# Patient Record
Sex: Female | Born: 1990 | Race: White | Hispanic: No | Marital: Single | Smoking: Current every day smoker
Health system: Southern US, Community
[De-identification: ages and names within clinical notes are randomized; demographics above are authoritative.]

## PROBLEM LIST (undated history)

## (undated) DIAGNOSIS — K219 Gastro-esophageal reflux disease without esophagitis: Secondary | ICD-10-CM

## (undated) DIAGNOSIS — F419 Anxiety disorder, unspecified: Secondary | ICD-10-CM

## (undated) DIAGNOSIS — I10 Essential (primary) hypertension: Secondary | ICD-10-CM

## (undated) DIAGNOSIS — F329 Major depressive disorder, single episode, unspecified: Secondary | ICD-10-CM

## (undated) DIAGNOSIS — F32A Depression, unspecified: Secondary | ICD-10-CM

## (undated) HISTORY — PX: WISDOM TOOTH EXTRACTION: SHX21

## (undated) HISTORY — PX: TONSILLECTOMY: SUR1361

---

## 2018-06-12 ENCOUNTER — Emergency Department (HOSPITAL_COMMUNITY)
Admission: EM | Admit: 2018-06-12 | Discharge: 2018-06-12 | Disposition: A | Discharge status: Home / Self Care | Payer: Commercial Managed Care - PPO | Attending: Emergency Medicine | Admitting: Emergency Medicine

## 2018-06-12 ENCOUNTER — Emergency Department (HOSPITAL_COMMUNITY): Payer: Commercial Managed Care - PPO

## 2018-06-12 ENCOUNTER — Other Ambulatory Visit: Payer: Self-pay

## 2018-06-12 ENCOUNTER — Encounter (HOSPITAL_COMMUNITY): Payer: Self-pay | Admitting: *Deleted

## 2018-06-12 DIAGNOSIS — Z79899 Other long term (current) drug therapy: Secondary | ICD-10-CM | POA: Insufficient documentation

## 2018-06-12 DIAGNOSIS — F17210 Nicotine dependence, cigarettes, uncomplicated: Secondary | ICD-10-CM | POA: Diagnosis not present

## 2018-06-12 DIAGNOSIS — R109 Unspecified abdominal pain: Secondary | ICD-10-CM

## 2018-06-12 DIAGNOSIS — R1084 Generalized abdominal pain: Secondary | ICD-10-CM | POA: Insufficient documentation

## 2018-06-12 DIAGNOSIS — I10 Essential (primary) hypertension: Secondary | ICD-10-CM | POA: Diagnosis not present

## 2018-06-12 HISTORY — DX: Depression, unspecified: F32.A

## 2018-06-12 HISTORY — DX: Gastro-esophageal reflux disease without esophagitis: K21.9

## 2018-06-12 HISTORY — DX: Essential (primary) hypertension: I10

## 2018-06-12 HISTORY — DX: Anxiety disorder, unspecified: F41.9

## 2018-06-12 HISTORY — DX: Major depressive disorder, single episode, unspecified: F32.9

## 2018-06-12 LAB — URINALYSIS, ROUTINE W REFLEX MICROSCOPIC
Bilirubin Urine: NEGATIVE
GLUCOSE, UA: NEGATIVE mg/dL
HGB URINE DIPSTICK: NEGATIVE
Ketones, ur: NEGATIVE mg/dL
LEUKOCYTES UA: NEGATIVE
Nitrite: NEGATIVE
Protein, ur: NEGATIVE mg/dL
Specific Gravity, Urine: 1.015 (ref 1.005–1.030)
pH: 8 (ref 5.0–8.0)

## 2018-06-12 LAB — POC URINE PREG, ED: PREG TEST UR: NEGATIVE

## 2018-06-12 MED ORDER — KETOROLAC TROMETHAMINE 15 MG/ML IJ SOLN
15.0000 mg | Freq: Once | INTRAMUSCULAR | Status: AC
  Administered 2018-06-12: 15 mg via INTRAVENOUS

## 2018-06-12 MED ORDER — KETOROLAC TROMETHAMINE 60 MG/2ML IM SOLN
60.0000 mg | Freq: Once | INTRAMUSCULAR | Status: DC
  Filled 2018-06-12: qty 2

## 2018-06-12 MED ORDER — CYCLOBENZAPRINE HCL 10 MG PO TABS: 10.0000 mg | ORAL_TABLET | Freq: Two times a day (BID) | ORAL | 0 refills | Status: AC | PRN

## 2018-06-12 MED ORDER — IBUPROFEN 800 MG PO TABS: 800.0000 mg | ORAL_TABLET | Freq: Three times a day (TID) | ORAL | 0 refills | Status: AC | PRN

## 2018-06-12 NOTE — ED Provider Notes (Signed)
[facility]-EMERGENCY DEPT Provider Note   CSN: [id number] Arrival date & time: 06/12/18  1128     History   Chief Complaint Chief Complaint  Patient presents with  . Flank Pain    HPI Carrie Lowe is a 27 y.o. female.  The history is provided by the patient. No language interpreter was used.  Flank Pain    Carrie Lowe is a 27 y.o. female who presents to the Emergency Department complaining of flank pain. Presents to the emergency department complaining of left flank pain. She is experienced some muscle cramping described as a Charlie horse sensation intermittently in the left flank for the last several days. Today she had a light sneeze and develop severe pain in the left side. Pain is worse with movement and laying flat. Being still lessons or pain. Pain is also worse when she takes a deep breath but she denies any shortness of breath. No fevers, nausea, vomiting, abdominal pain, dysuria, hematuria, numbness, weakness. She is able to ambulate but has pain in her side when she moves. No prior similar symptoms. She has a history of hypertension. She is a smoker. She does not using contraceptives. Past Medical History:  Diagnosis Date  . Anxiety   . Depression   . GERD (gastroesophageal reflux disease)   . Hypertension     There are no active problems to display for this patient.   Past Surgical History:  Procedure Laterality Date  . TONSILLECTOMY    . WISDOM TOOTH EXTRACTION       OB History   None      Home Medications    Prior to Admission medications   Medication Sig Start Date End Date Taking? Authorizing Provider  desvenlafaxine (PRISTIQ) 50 MG 24 hr tablet Take 50 mg by mouth daily before breakfast. 05/22/18  Yes [provider]  lisinopril (PRINIVIL,ZESTRIL) 20 MG tablet Take 20 mg by mouth daily. 03/29/18  Yes [provider]  omeprazole (PRILOSEC) 20 MG capsule Take 20 mg by mouth daily. 03/29/18  Yes [provider]  traZODone (DESYREL) 50 MG tablet Take 50 mg by mouth at bedtime. 05/22/18  Yes [provider]  ALPRAZolam Prudy Feeler(XANAX) 0.5 MG tablet Take 0.5 mg by mouth daily as needed for anxiety. 05/22/18   [provider]  cyclobenzaprine (FLEXERIL) 10 MG tablet Take 1 tablet (10 mg total) by mouth 2 (two) times daily as needed for muscle spasms. 06/12/18   Tilden Fossaees, Haelie Clapp, MD  ibuprofen (ADVIL,MOTRIN) 800 MG tablet Take 1 tablet (800 mg total) by mouth every 8 (eight) hours as needed. 06/12/18   Tilden Fossaees, Avenir Lozinski, MD  ranitidine (ZANTAC) 300 MG tablet Take 300 mg by mouth at bedtime. 06/05/18   [provider]    Family History No family history on file.  Social History Social History   Tobacco Use  . Smoking status: Current Every Day Smoker    Packs/day: 0.75    Types: Cigarettes  . Smokeless tobacco: Never Used  Substance Use Topics  . Alcohol use: Yes    Comment: social  . Drug use: Never     Allergies   Diphenhydramine and Sulfa antibiotics   Review of Systems Review of Systems  Genitourinary: Positive for flank pain.  All other systems reviewed and are negative.    Physical Exam Updated Vital Signs BP 130/90 (BP Location: Left Arm)   Pulse 62   Temp 98 F (36.7 C) (Oral)   Resp 16   Ht 5\' 5"  (  1.651 m)   Wt 97.5 kg   LMP 04/24/2018   SpO2 100%   BMI 35.78 kg/m   Physical Exam  Constitutional: She is oriented to person, place, and time. She appears well-developed and well-nourished.  HENT:  Head: Normocephalic and atraumatic.  Cardiovascular: Normal rate and regular rhythm.  No murmur heard. Pulmonary/Chest: Effort normal and breath sounds normal. No respiratory distress.  Mild left mid axillary tenderness to palpation  Abdominal: Soft. There is no tenderness. There is no rebound and no guarding.  Musculoskeletal: She exhibits no edema or tenderness.  Neurological: She is alert and oriented to person, place, and time.  Sensation to light touch  intact in all four extremities.    Skin: Skin is warm and dry.  Psychiatric: She has a normal mood and affect. Her behavior is normal.  Nursing note and vitals reviewed.    ED Treatments / Results  Labs (all labs ordered are listed, but only abnormal results are displayed) Labs Reviewed  URINALYSIS, ROUTINE W REFLEX MICROSCOPIC  POC URINE PREG, ED    EKG None  Radiology Dg Ribs Unilateral W/chest Left  Result Date: 06/12/2018 CLINICAL DATA:  Posterolateral left lower rib pain after sneezing EXAM: LEFT RIBS AND CHEST - 3+ VIEW COMPARISON:  None. FINDINGS: Heart and mediastinal contours are within normal limits. No focal opacities or effusions. No acute bony abnormality. No visible rib fracture or pneumothorax. IMPRESSION: Negative. Electronically Signed   By: Charlett NoseKevin  Dover M.D.   On: 06/12/2018 13:46    Procedures Procedures (including critical care time)  Medications Ordered in ED Medications  ketorolac (TORADOL) 15 MG/ML injection 15 mg (15 mg Intravenous Given 06/12/18 1309)     Initial Impression / Assessment and Plan / ED Course  I have reviewed the triage vital signs and the nursing notes.  Pertinent labs & imaging results that were available during my care of the patient were reviewed by me and considered in my medical decision making (see chart for details).     Pt here for evaluation of left flank pain. She is neurovascular intact on examination. Presentation is not consistent with PE (perc negative), Renal colic, UTI, cauda equina, shingles. Pain is improved after Toradol in the department. Discussed patient home care for flank pain. Discussed outpatient follow-up as well as return precautions.  Final Clinical Impressions(s) / ED Diagnoses   Final diagnoses:  Left flank pain    ED Discharge Orders         Ordered    cyclobenzaprine (FLEXERIL) 10 MG tablet  2 times daily PRN     06/12/18 1413    ibuprofen (ADVIL,MOTRIN) 800 MG tablet  Every 8 hours PRN      06/12/18 1413           Tilden Fossaees, Silvino Selman, MD 06/12/18 [phone]

## 2018-06-12 NOTE — ED Triage Notes (Signed)
Pt reports flank pain x 5 or 6 days.  Pt reports pain increases with movement. Pt stood up and sneezed this morning and pain began severe.  Pt reports she hasn't been taking anything pain medication.  Pt denies n/v, fevers and chills, or urinary symptoms.  Pt a/o x 4 and ambulatory.

## 2019-12-01 IMAGING — CR DG RIBS W/ CHEST 3+V*L*
4 series · 4 of 4 positions shown · non-contrast
Comparison: None.

CLINICAL DATA: Posterolateral left lower rib pain after sneezing

EXAM:
LEFT RIBS AND CHEST - 3+ VIEW

[w chest pa]
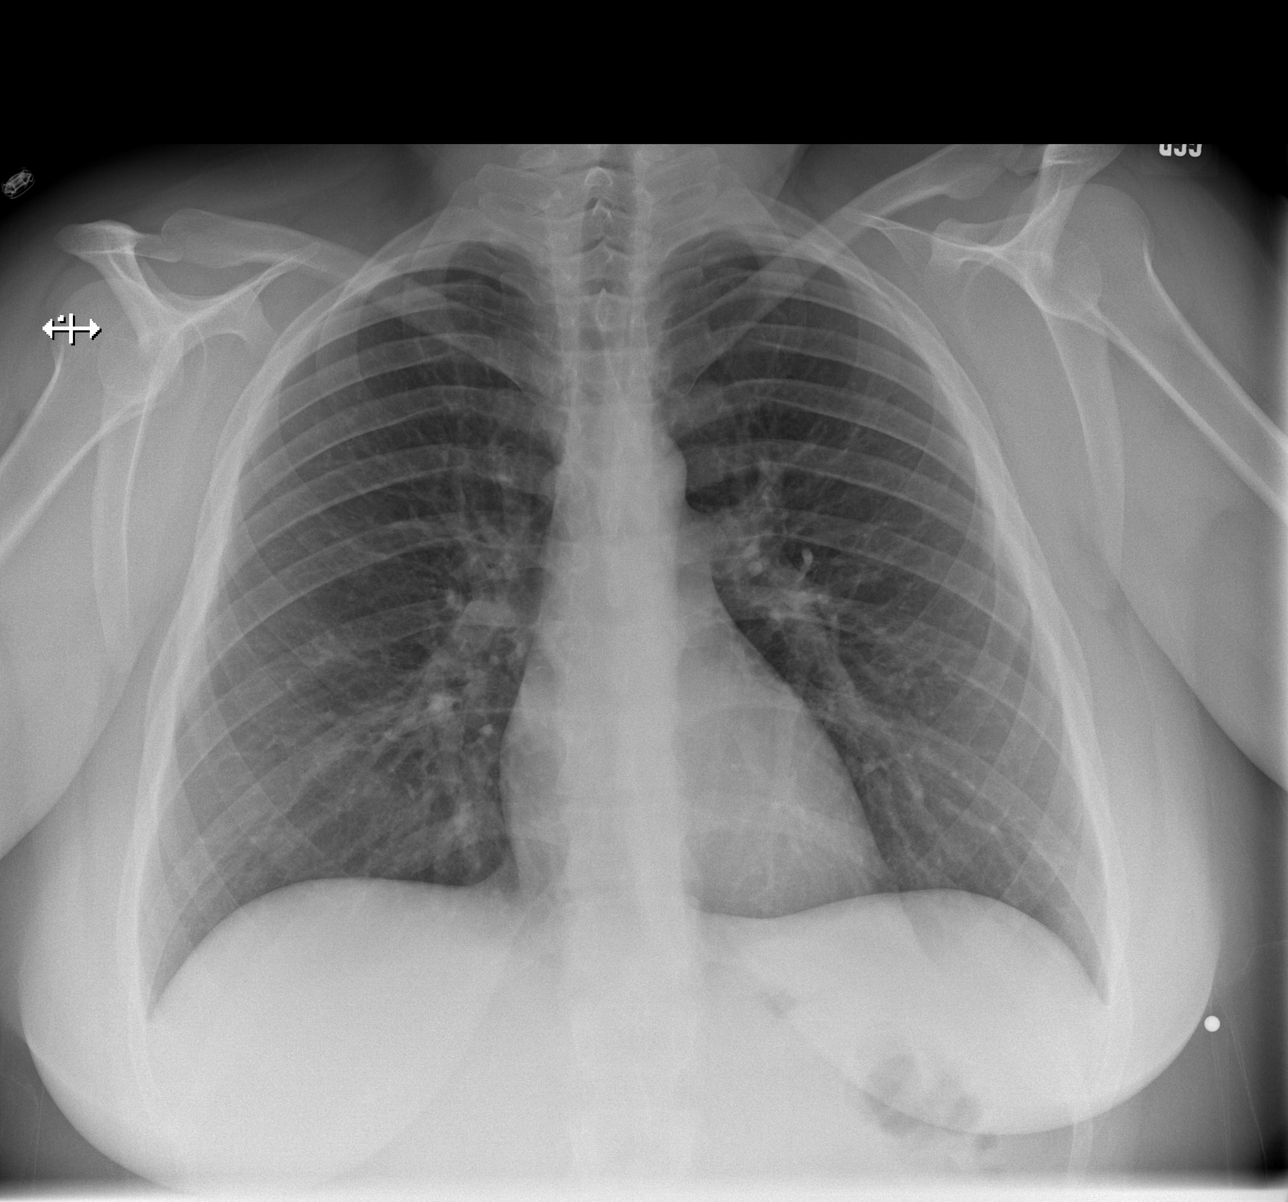

[w ribs ap lower left]
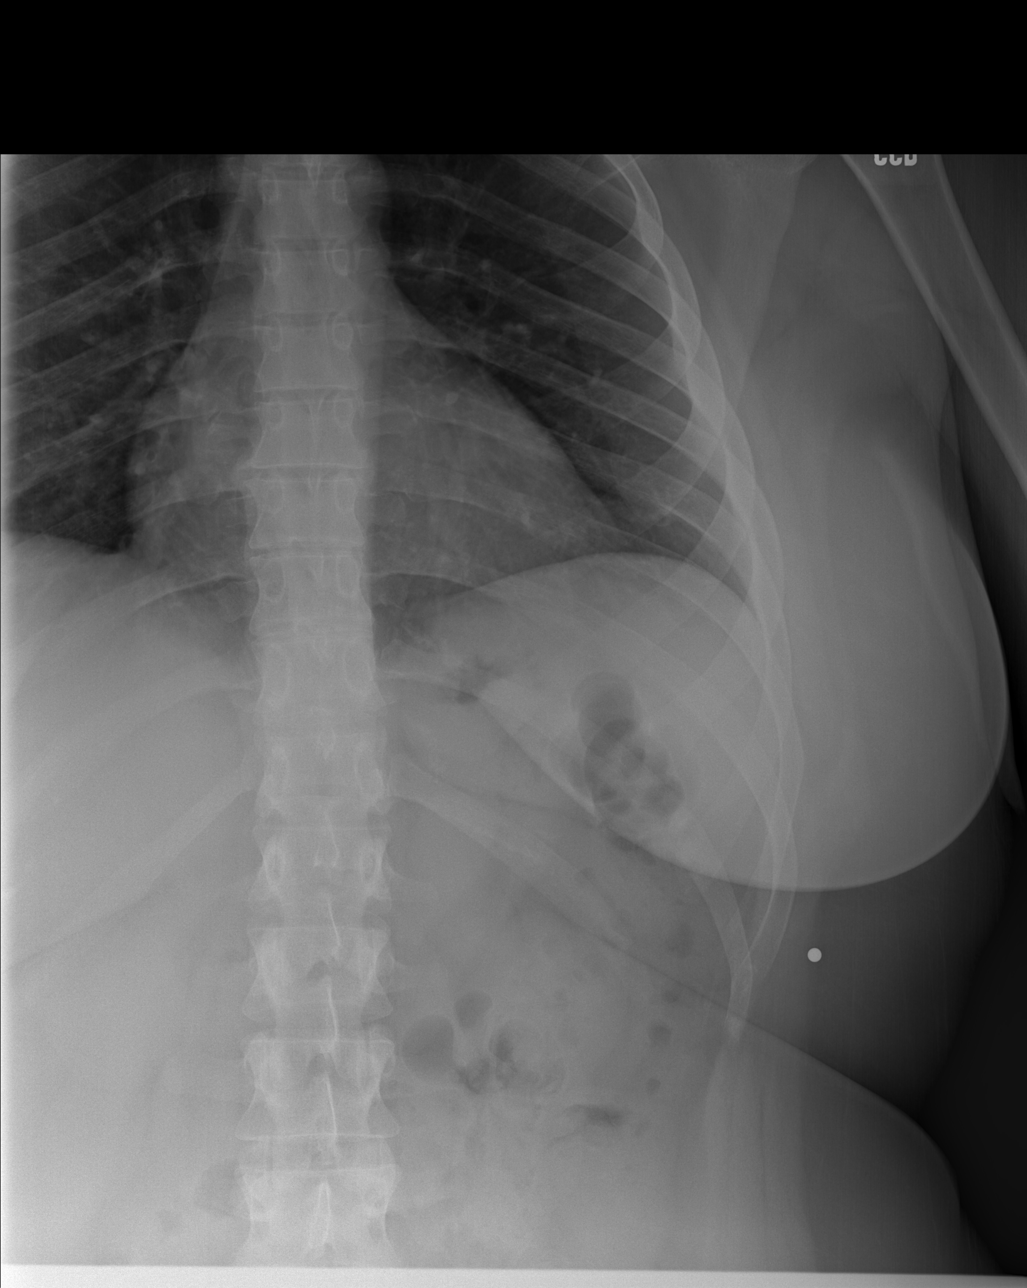

[w ribs obl left (1 of 2)]
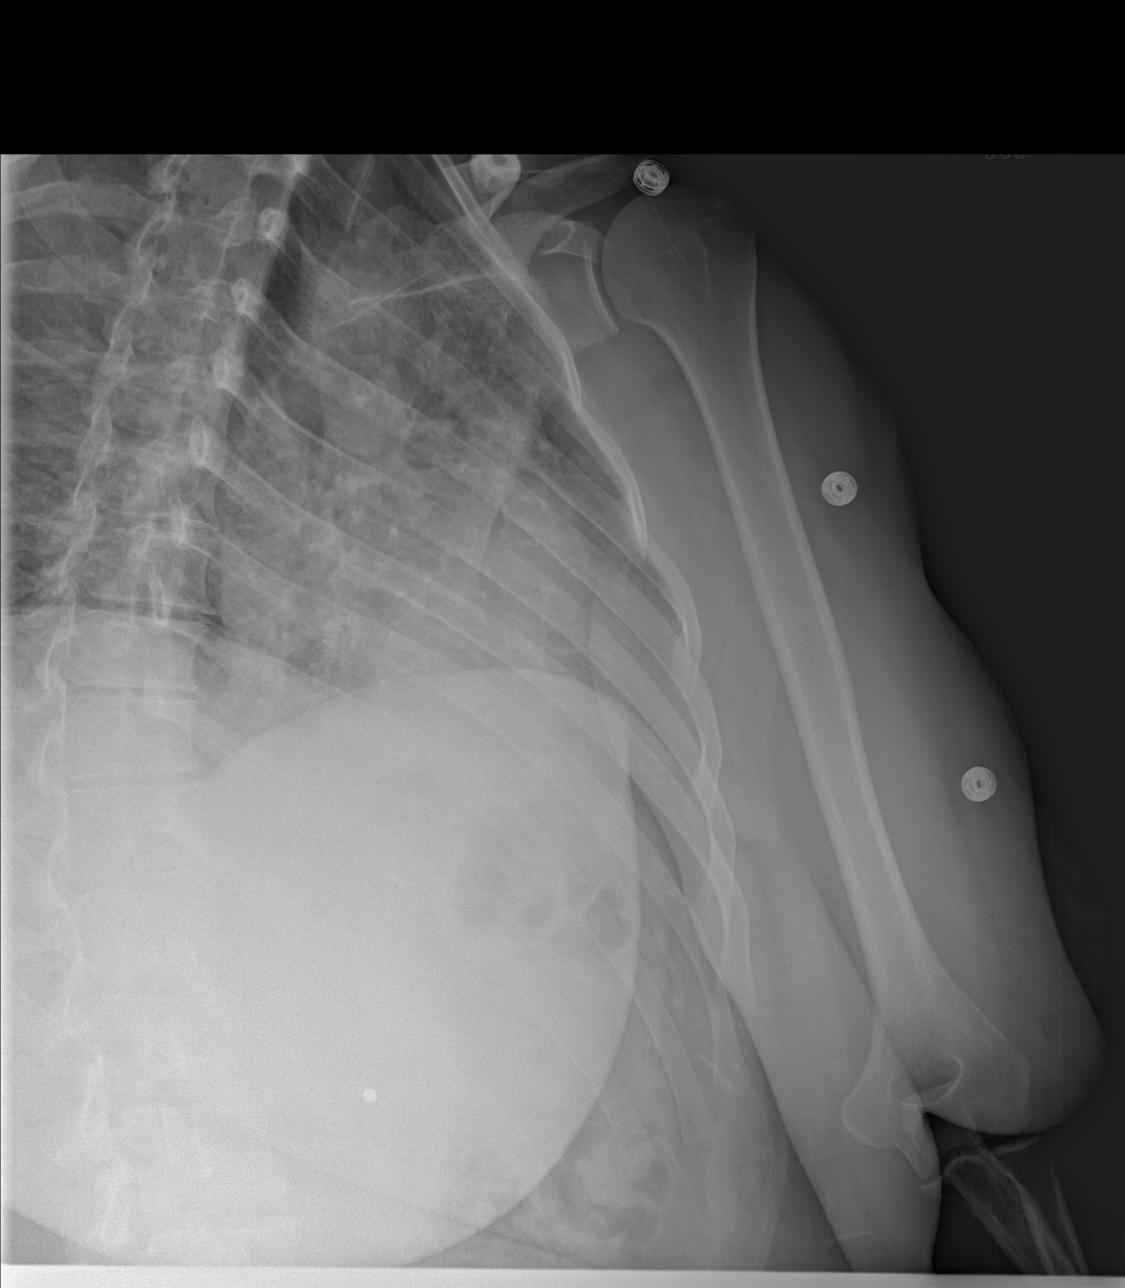

[w ribs obl left (2 of 2)]
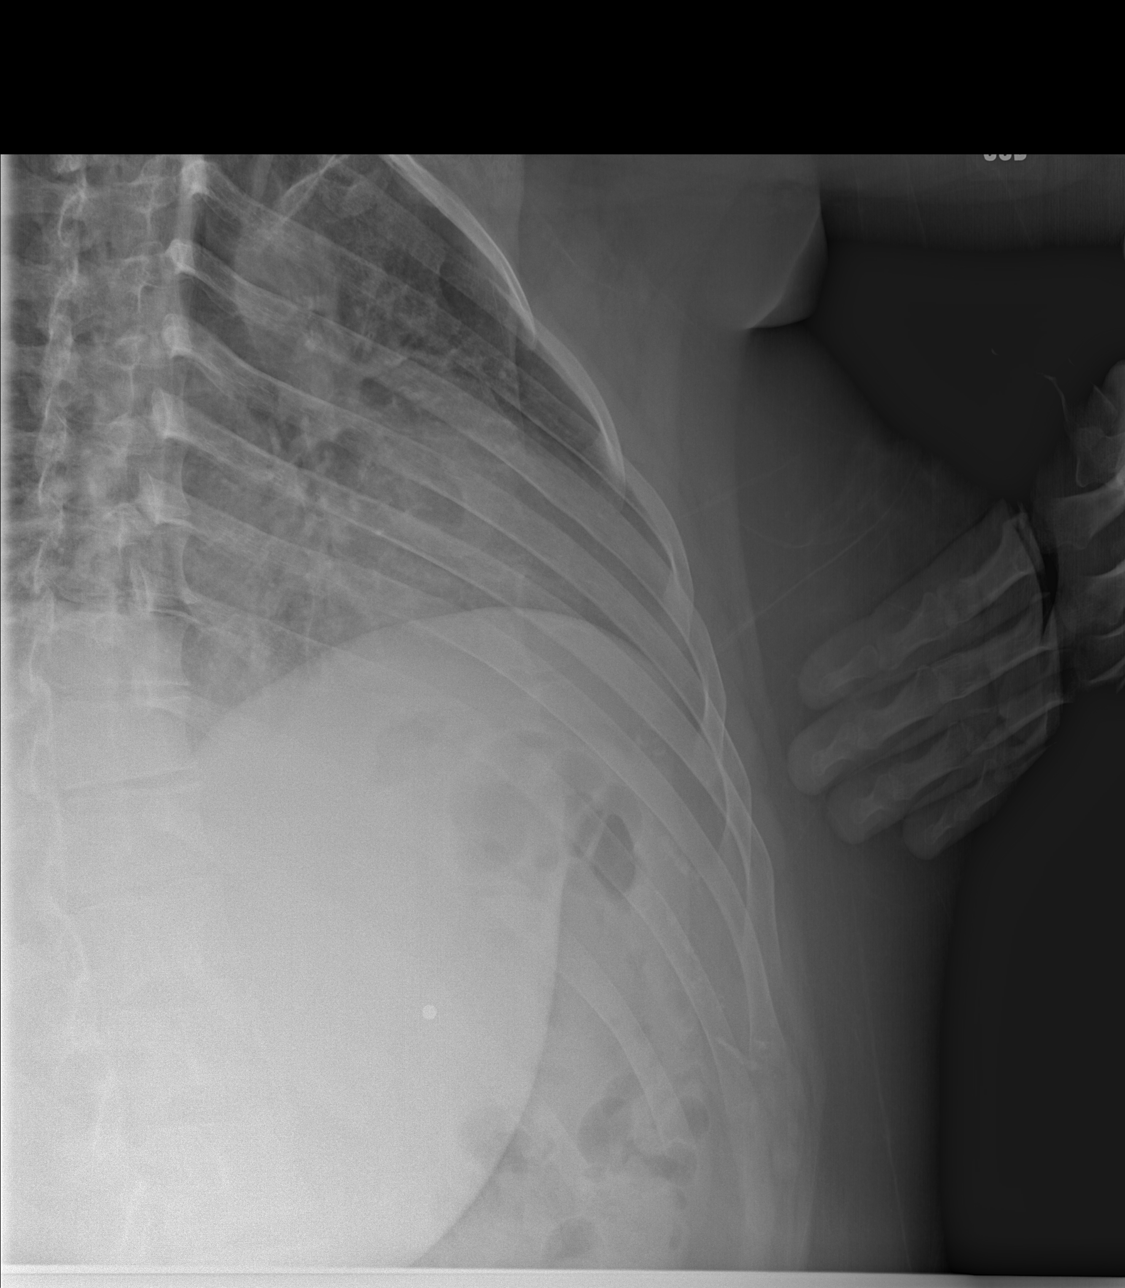

[4 of 4 positions shown; findings below may reference images not displayed]

FINDINGS: Heart and mediastinal contours are within normal limits. No focal
opacities or effusions. No acute bony abnormality. No visible rib
fracture or pneumothorax.
IMPRESSION: Negative.
